# Patient Record
Sex: Male | Born: 1975 | Hispanic: Yes | Marital: Married | Smoking: Never smoker
Health system: Southern US, Community
[De-identification: ages and names within clinical notes are randomized; demographics above are authoritative.]

---

## 2013-05-29 ENCOUNTER — Emergency Department: Payer: Self-pay | Admitting: Emergency Medicine

## 2013-06-01 ENCOUNTER — Ambulatory Visit: Payer: Self-pay | Admitting: Chiropractor

## 2013-06-10 ENCOUNTER — Ambulatory Visit: Payer: Self-pay | Admitting: Chiropractor

## 2014-03-07 ENCOUNTER — Encounter: Payer: Self-pay | Admitting: General Surgery

## 2014-03-07 ENCOUNTER — Ambulatory Visit (INDEPENDENT_AMBULATORY_CARE_PROVIDER_SITE_OTHER): Payer: Medicaid Other | Admitting: General Surgery

## 2014-03-07 VITALS — BP 130/76 | HR 80 | Resp 12 | Ht 68.0 in | Wt 196.0 lb

## 2014-03-07 DIAGNOSIS — R1031 Right lower quadrant pain: Secondary | ICD-10-CM

## 2014-03-07 NOTE — Patient Instructions (Addendum)
Patient to return in six weeks. The patient is aware to use an antiinflammatory of choice (Advil or Aleve) as needed for comfort. Hernia (Hernia) Una hernia ocurre cuando un rgano interno protruye a travs de un punto dbil de los msculos de la pared muscular abdominal (del vientre). Se producen con mayor frecuencia en la ingle y alrededor del ombligo. Generalmente puede volver a Museum/gallery exhibitions officercolocarse en su lugar (reducirse). La mayor parte de las hernias tienden a Theme park managerempeorar con Museum/gallery conservatorel tiempo. Algunas hernias abdominales pueden atascarse en la abertura (hernias irreductibles o hernia encarcelada) y no pueden reducirse. Una hernia abdominal irreducible que est ligeramente apretada en la abertura, corre el riesgo de daar el flujo de sangre (hernia estrangulada). Una hernia estrangulada es una emergencia mdica. Debido al riesgo que se corre en caso de hernia irreducible o estrangulada, se recomienda la ciruga para repararla. CAUSAS  Levantar peso excesivo.  Mucha tos.  Tensin al ir de cuerpo.  Durante la ciruga abdominal se realiza un corte (incisin). INSTRUCCIONES PARA EL CUIDADO DOMICILIARIO  No es necesario hacer reposo en cama. Puede continuar con sus actividades habituales.  Evite levantar peso (ms de 10 libras o 4,5 Kg) o hacer esfuerzos.  Tos con suavidad. Si actualmente usted fuma, es el momento de abandonar el hbito. Hasta el procedimiento quirrgico ms perfecto puede malograrse si se hace fuerza contnua para toser. Aunque su hernia no haya sido reparada, la tos puede agravar el problema.  No use nada apretado sobre la hernia. No trate de mantenerla adentro con un vendaje externo o un braguero. Puede lesionar el contenido abdominal si los aprieta dentro del saco de la hernia.  Consumir una dieta normal.  Evite la constipacin. Si hace mucha fuerza aumentar el tamao de la hernia y podr daarse la reparacin. Si no lo logra slo con la dieta, puede usar laxantes. SOLICITE ATENCIN MDICA  DE INMEDIATO SI:  Tiene fiebre.  Presenta un dolor abdominal cada vez ms intenso.  Si tiene Programme researcher, broadcasting/film/videomalestar estomacal (nuseas) y vmitos.  La hernia se ha atascado fuera del abdomen, se ve descolorida, se siente dura o le duele.  Observa cambios en el hbito intestinal o en la hernia, lo que no es habitual en usted.  El dolor o la hinchazn alrededor de la hernia [place] Beachaumentan.  No puede volver a Electrical engineercolocar la hernia en su lugar ejerciendo una presin suave mientras se encuentra recostado. EST SEGURO QUE:   Comprende las instrucciones para el alta mdica.  Controlar su enfermedad.  Solicitar atencin mdica de inmediato segn las indicaciones. Document Released: 10/27/2005 Document Revised: 01/19/2012 [facility] Patient Information 2014 [employer], [place].

## 2014-03-07 NOTE — Progress Notes (Signed)
Patient ID: Evan Fry Cone, male   DOB: 06/13/1976, 38 y.o.   MRN: [id number]  Chief Complaint  Patient presents with  . Other    inguinal hernia    HPI Evan Fry Swartz is a 38 y.o. male here today for a evaluation of a right inguinal hernia. Patient noticed this about a year ago. He states when he lift something he feels a pull in the right inguinal area. He noticed that his right testicle is swelling occasionally.  HPI  History reviewed. No pertinent past medical history.  History reviewed. No pertinent past surgical history.  History reviewed. No pertinent family history.  Social History History  Substance Use Topics  . Smoking status: Never Smoker   . Smokeless tobacco: Never Used  . Alcohol Use: No    No Known Allergies  Current Outpatient Prescriptions  Medication Sig Dispense Refill  . diphenhydrAMINE (BENADRYL) 25 MG tablet Take 25 mg by mouth every 6 (six) hours as needed.       No current facility-administered medications for this visit.    Review of Systems Review of Systems  Constitutional: Positive for fever. Negative for chills, diaphoresis, activity change, appetite change, fatigue and unexpected weight change.  Respiratory: Negative.   Cardiovascular: Negative.     Blood pressure 130/76, pulse 80, resp. rate 12, height 5\' 8"  (1.727 m), weight 196 lb (88.905 kg).  Physical Exam Physical Exam  Constitutional: He is oriented to person, place, and time. He appears well-developed and well-nourished.  Eyes: Conjunctivae are normal.  Neck: Neck supple.  Cardiovascular: Normal rate, regular rhythm and normal heart sounds.   Pulmonary/Chest: Breath sounds normal.  Abdominal: Soft. Normal appearance and bowel sounds are normal. There is no tenderness. No hernia.  Genitourinary: Testes normal.  Neurological: He is alert and oriented to person, place, and time.  Skin: Skin is warm and dry.  Exam with pt standing and lying down, no impulse noted in right groin when pt  strains or coughs.  Data Reviewed None   Assessment    No findings in right groin to account for his pain. Discussed fully with pt. Hernia information given to him    Plan    Patient to return in six weeks. The patient is aware to use an antiinflammatory of choice (Advil or Aleve) as needed for comfort.    Deavon Podgorski G Awad Gladd 03/07/2014, 1:08 PM

## 2014-04-24 ENCOUNTER — Ambulatory Visit: Payer: Medicaid Other | Admitting: General Surgery

## 2014-04-27 ENCOUNTER — Encounter: Payer: Self-pay | Admitting: *Deleted
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
